# Patient Record
Sex: Female | Born: 1985 | Race: White | Hispanic: No | Marital: Single | Smoking: Current every day smoker
Health system: Southern US, Community
[De-identification: ages and names within clinical notes are randomized; demographics above are authoritative.]

## PROBLEM LIST (undated history)

## (undated) DIAGNOSIS — O1490 Unspecified pre-eclampsia, unspecified trimester: Secondary | ICD-10-CM

## (undated) DIAGNOSIS — O24419 Gestational diabetes mellitus in pregnancy, unspecified control: Secondary | ICD-10-CM

## (undated) DIAGNOSIS — J9383 Other pneumothorax: Secondary | ICD-10-CM

---

## 2015-02-23 ENCOUNTER — Emergency Department (HOSPITAL_COMMUNITY): Payer: Medicaid Other

## 2015-02-23 ENCOUNTER — Emergency Department (HOSPITAL_COMMUNITY)
Admission: EM | Admit: 2015-02-23 | Discharge: 2015-02-23 | Disposition: A | Discharge status: Home / Self Care | Payer: Medicaid Other | Attending: Emergency Medicine | Admitting: Emergency Medicine

## 2015-02-23 ENCOUNTER — Encounter (HOSPITAL_COMMUNITY): Payer: Self-pay | Admitting: Nurse Practitioner

## 2015-02-23 DIAGNOSIS — R200 Anesthesia of skin: Secondary | ICD-10-CM | POA: Insufficient documentation

## 2015-02-23 DIAGNOSIS — R1031 Right lower quadrant pain: Secondary | ICD-10-CM | POA: Insufficient documentation

## 2015-02-23 DIAGNOSIS — M545 Low back pain: Secondary | ICD-10-CM | POA: Insufficient documentation

## 2015-02-23 DIAGNOSIS — Z8632 Personal history of gestational diabetes: Secondary | ICD-10-CM | POA: Insufficient documentation

## 2015-02-23 DIAGNOSIS — R519 Headache, unspecified: Secondary | ICD-10-CM

## 2015-02-23 DIAGNOSIS — R61 Generalized hyperhidrosis: Secondary | ICD-10-CM | POA: Diagnosis not present

## 2015-02-23 DIAGNOSIS — R159 Full incontinence of feces: Secondary | ICD-10-CM | POA: Diagnosis not present

## 2015-02-23 DIAGNOSIS — R1032 Left lower quadrant pain: Secondary | ICD-10-CM | POA: Diagnosis not present

## 2015-02-23 DIAGNOSIS — R51 Headache: Secondary | ICD-10-CM | POA: Insufficient documentation

## 2015-02-23 DIAGNOSIS — R202 Paresthesia of skin: Secondary | ICD-10-CM | POA: Insufficient documentation

## 2015-02-23 DIAGNOSIS — Z720 Tobacco use: Secondary | ICD-10-CM | POA: Diagnosis not present

## 2015-02-23 HISTORY — DX: Other pneumothorax: J93.83

## 2015-02-23 HISTORY — DX: Gestational diabetes mellitus in pregnancy, unspecified control: O24.419

## 2015-02-23 HISTORY — DX: Unspecified pre-eclampsia, unspecified trimester: O14.90

## 2015-02-23 LAB — CBC
HCT: 43.4 % (ref 36.0–46.0)
Hemoglobin: 14.9 g/dL (ref 12.0–15.0)
MCH: 31.2 pg (ref 26.0–34.0)
MCHC: 34.3 g/dL (ref 30.0–36.0)
MCV: 90.8 fL (ref 78.0–100.0)
PLATELETS: 236 10*3/uL (ref 150–400)
RBC: 4.78 MIL/uL (ref 3.87–5.11)
RDW: 12.7 % (ref 11.5–15.5)
WBC: 7.6 10*3/uL (ref 4.0–10.5)

## 2015-02-23 LAB — COMPREHENSIVE METABOLIC PANEL
ALBUMIN: 4 g/dL (ref 3.5–5.0)
ALK PHOS: 75 U/L (ref 38–126)
ALT: 16 U/L (ref 14–54)
AST: 21 U/L (ref 15–41)
Anion gap: 8 (ref 5–15)
BUN: 7 mg/dL (ref 6–20)
CHLORIDE: 101 mmol/L (ref 101–111)
CO2: 27 mmol/L (ref 22–32)
CREATININE: 0.72 mg/dL (ref 0.44–1.00)
Calcium: 9.4 mg/dL (ref 8.9–10.3)
GFR calc non Af Amer: >60 mL/min (ref >60)
GLUCOSE: 82 mg/dL (ref 65–99)
Potassium: 3.8 mmol/L (ref 3.5–5.1)
SODIUM: 136 mmol/L (ref 135–145)
Total Bilirubin: 0.3 mg/dL (ref 0.3–1.2)
Total Protein: 6.7 g/dL (ref 6.5–8.1)

## 2015-02-23 LAB — I-STAT BETA HCG BLOOD, ED (MC, WL, AP ONLY): I-stat hCG, quantitative: <5 m[IU]/mL (ref <5)

## 2015-02-23 LAB — URINE MICROSCOPIC-ADD ON

## 2015-02-23 LAB — URINALYSIS, ROUTINE W REFLEX MICROSCOPIC
Bilirubin Urine: NEGATIVE
GLUCOSE, UA: NEGATIVE mg/dL
Ketones, ur: NEGATIVE mg/dL
LEUKOCYTES UA: NEGATIVE
Nitrite: NEGATIVE
PH: 6.5 (ref 5.0–8.0)
Protein, ur: NEGATIVE mg/dL
Specific Gravity, Urine: 1.007 (ref 1.005–1.030)
Urobilinogen, UA: 0.2 mg/dL (ref 0.0–1.0)

## 2015-02-23 MED ORDER — DIPHENHYDRAMINE HCL 50 MG/ML IJ SOLN
25.0000 mg | Freq: Once | INTRAMUSCULAR | Status: DC
Start: 2015-02-23 — End: 2015-02-23
  Filled 2015-02-23: qty 1

## 2015-02-23 MED ORDER — ACETAMINOPHEN 325 MG PO TABS
650.0000 mg | ORAL_TABLET | Freq: Once | ORAL | Status: AC
  Administered 2015-02-23: 650 mg via ORAL
  Filled 2015-02-23: qty 2

## 2015-02-23 MED ORDER — GADOBENATE DIMEGLUMINE 529 MG/ML IV SOLN
10.0000 mL | Freq: Once | INTRAVENOUS | Status: AC | PRN
  Administered 2015-02-23: 10 mL via INTRAVENOUS

## 2015-02-23 MED ORDER — PROMETHAZINE HCL 25 MG/ML IJ SOLN
25.0000 mg | Freq: Once | INTRAMUSCULAR | Status: DC
  Filled 2015-02-23: qty 1

## 2015-02-23 MED ORDER — MORPHINE SULFATE (PF) 2 MG/ML IV SOLN
2.0000 mg | Freq: Once | INTRAVENOUS | Status: DC
  Filled 2015-02-23: qty 1

## 2015-02-23 NOTE — ED Notes (Signed)
Pt refusing anything but tylenol.  Pt is driving.

## 2015-02-23 NOTE — ED Notes (Signed)
Patient transported to MRI 

## 2015-02-23 NOTE — ED Notes (Signed)
MRI to watch pts child until husband arrives in ED.

## 2015-02-23 NOTE — Discharge Instructions (Signed)
Please follow-up with Neurology for further evaluation of your symptoms. Your bloodwork, CT scan, and MRI did not show any cause for your symptoms today. Return to the ER if you have worsening or new symptoms such as high fever, seizures, fainting, chest pain, difficulty breathing, etc.    Please obtain all of your results from medical records or have your doctors office obtain the results - share them with your doctor - you should be seen at your doctors office in the next 2 days. Call today to arrange your follow up. Take the medications as prescribed. Please review all of the medicines and only take them if you do not have an allergy to them. Please be aware that if you are taking birth control pills, taking other prescriptions, ESPECIALLY ANTIBIOTICS may make the birth control ineffective - if this is the case, either do not engage in sexual activity or use alternative methods of birth control such as condoms until you have finished the medicine and your family doctor says it is OK to restart them. If you are on a blood thinner such as COUMADIN, be aware that any other medicine that you take may cause the coumadin to either work too much, or not enough - you should have your coumadin level rechecked in next 7 days if this is the case.  ?  It is also a possibility that you have an allergic reaction to any of the medicines that you have been prescribed - Everybody reacts differently to medications and while MOST people have no trouble with most medicines, you may have a reaction such as nausea, vomiting, rash, swelling, shortness of breath. If this is the case, please stop taking the medicine immediately and contact your physician.  ?  You should return to the ER if you develop severe or worsening symptoms.

## 2015-02-23 NOTE — ED Notes (Signed)
She c/o 3 day history headache when standing, numbness in BUE and BLE, losing control of her bowel and bladder, and lower back and flank pain when waking in the morning. She tried tylenol with no relief. She denies nausea, dizziness, vision chages, LOC. She is A&Ox4, resp e/u

## 2015-02-23 NOTE — ED Provider Notes (Signed)
CSN: [id number]     Arrival date & time 02/23/15  1218 History   First MD Initiated Contact with Patient 02/23/15 1430     Chief Complaint  Patient presents with  . Headache    Patient is a 29 y.o. female presenting with headaches.  Headache    Tracy Werner is an 29 y.o. female with h/o HTN who presents to the ED for evaluation of headache. She states the headache started 3 days ago. States onset was gradual and it has gotten worse and worse. She describes the pain as diffuse and constant. Denies prior history of headache. It is worse with postural change. She states that she has also had bowel incontinence, once every day for the past 3 days. States she will be sitting at home and with no warning will have a BM. Denies bladder incontinence. She does report low back and bilateral flank pain over the past 3 days. Denies dysuria or urinary frequency/urgency. States that she has noticed seeing spots with this new headache. Denies blurred vision or vision loss. She reports night sweats and hot flashes the past few nights as well. She does endorse numbness and tingling in her fingers and toes that has been going on over the past year. Denies saddle parasthesias. Denies syncope, fever, dizziness, N/V, chest pain, SOB. Denies IVDU. Denies unsteady gait or falls. Denies unintentional weight loss.   Past Medical History  Diagnosis Date  . Preeclampsia   . Gestational diabetes mellitus   . Spontaneous pneumothorax    Past Surgical History  Procedure Laterality Date  . Cesarean section  2014   History reviewed. No pertinent family history. Social History  Substance Use Topics  . Smoking status: Current Every Day Smoker    Types: Cigarettes  . Smokeless tobacco: None  . Alcohol Use: No   OB History    No data available     Review of Systems  Neurological: Positive for headaches.  All other systems reviewed and are negative.     Allergies  Augmentin  Home Medications   Prior to  Admission medications   Not on File   BP 139/96 mmHg  Pulse 102  Temp(Src) 98.5 F (36.9 C) (Oral)  Resp 16  SpO2 99%  LMP 02/18/2015 Physical Exam  Constitutional: She is oriented to person, place, and time. No distress.  HENT:  Right Ear: External ear normal.  Left Ear: External ear normal.  Nose: Nose normal.  Mouth/Throat: Oropharynx is clear and moist. No oropharyngeal exudate.  Eyes: Conjunctivae and EOM are normal. Pupils are equal, round, and reactive to light.  Neck: Normal range of motion. Neck supple.  Cardiovascular: Normal rate, regular rhythm, normal heart sounds and intact distal pulses.   No murmur heard. Pulmonary/Chest: Effort normal and breath sounds normal. No respiratory distress. She has no wheezes.  Abdominal: Soft. Bowel sounds are normal. She exhibits no distension. There is no tenderness. There is no CVA tenderness.  Genitourinary: Rectal exam shows anal tone normal.  Musculoskeletal: Normal range of motion. She exhibits no edema or tenderness.  Entire spine nontender.   Lymphadenopathy:    She has no cervical adenopathy.  Neurological: She is alert and oriented to person, place, and time.  A&O x3. Cranial nerves intact. UE and LE strength and sensation intact. Negative romberg. Negative pronator drift. Steady gait. Normal rectal tone.   Skin: Skin is warm and dry. She is not diaphoretic.  Psychiatric: She has a normal mood and affect.  Nursing note and  vitals reviewed.   ED Course  Procedures (including critical care time) Labs Review Labs Reviewed  URINALYSIS, ROUTINE W REFLEX MICROSCOPIC (NOT AT [facility]) - Abnormal; Notable for the following:    Hgb urine dipstick SMALL (*)    All other components within normal limits  URINE MICROSCOPIC-ADD ON - Abnormal; Notable for the following:    Squamous Epithelial / LPF FEW (*)    All other components within normal limits  COMPREHENSIVE METABOLIC PANEL  CBC  I-STAT BETA HCG BLOOD, ED ([facility], [facility], [facility] ONLY)     Imaging Review Ct Head Wo Contrast  02/23/2015  CLINICAL DATA:  Three-day history of headache.  Bowel incontinence. EXAM: CT HEAD WITHOUT CONTRAST TECHNIQUE: Contiguous axial images were obtained from the base of the skull through the vertex without intravenous contrast. COMPARISON:  None. FINDINGS: The ventricles are normal in size and configuration. There is no intracranial mass, hemorrhage, extra-axial fluid collection, or midline shift. Gray-white compartments are normal. No acute infarct evident. The bony calvarium appears intact. The mastoid air cells are clear. IMPRESSION: Study within normal limits. Electronically Signed   By: Bretta BangWilliam  Woodruff III M.D.   On: 02/23/2015 18:13   Mr Lumbar Spine W Wo Contrast  02/23/2015  CLINICAL DATA:  Low back and bilateral flank pain for 3 days with bowel incontinence. New headaches with visual changes. No acute injury or history of malignancy. Initial encounter. EXAM: MRI LUMBAR SPINE WITHOUT AND WITH CONTRAST TECHNIQUE: Multiplanar and multiecho pulse sequences of the lumbar spine were obtained without and with intravenous contrast. CONTRAST:  10mL MULTIHANCE GADOBENATE DIMEGLUMINE 529 MG/ML IV SOLN COMPARISON:  None. FINDINGS: Five lumbar type vertebral bodies are assumed. The alignment is normal aside from a possible mild scoliosis. There is no evidence of fracture or pars defect. The conus medullaris extends to the L1 level and appears normal. There is no abnormal intradural enhancement. There are no paraspinal abnormalities. The right kidney appears normal. The left kidney is not visualized in its normal anatomic location. All of the lumbar discs appear normal. There is no spinal stenosis or nerve root encroachment. There is no significant facet arthropathy. IMPRESSION: 1. Negative MRI of the lumbar spine. No explanation for the patient's symptoms. The distal thoracic cord and conus medullaris appear normal. 2. Nonvisualization of the left kidney in its  normal location. This may be secondary to an ectopic kidney, previous nephrectomy or congenital absence/hypoplasia. Electronically Signed   By: Carey BullocksWilliam  Veazey M.D.   On: 02/23/2015 16:30   I have personally reviewed and evaluated these images and lab results as part of my medical decision-making.   EKG Interpretation None      MDM   Final diagnoses:  Bowel incontinence  Headache, unspecified headache type    Given new headache with bowel incontinence, visual disturbances, night sweats, 1y history of extremity paresthesias, I am concerned about infectious vs neoplastic process. However, it is odd as she does not have fever, risk factors, or neuro exam as expected for cauda equina/epidural abscess, brain bleed/growth, etc. Given new headache and back pain with these concerning symptoms, will get CT head and MRI L-spine. Will check CMP, CBC, UA.    Labs unremarkable. 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 CT normal. Discussed results with pt. I cannot give explanation of her symptoms at this time. Will refer to neurology for outpatient f/u. Pt declines pain meds at this time. Return precautions given for new or worsening symptoms. Pt verbalized understanding.    Tracy GinsSerena Y  Yashvi Jasinski, PA-C 02/23/15 1846  Gilda Crease, MD 02/24/15 [phone]

## 2015-02-27 ENCOUNTER — Ambulatory Visit: Payer: Self-pay | Admitting: Neurology

## 2015-03-04 ENCOUNTER — Ambulatory Visit (INDEPENDENT_AMBULATORY_CARE_PROVIDER_SITE_OTHER): Payer: Medicaid Other | Admitting: Neurology

## 2015-03-04 ENCOUNTER — Encounter: Payer: Self-pay | Admitting: Neurology

## 2015-03-04 VITALS — BP 142/101 | HR 126 | Ht 66.0 in | Wt 122.5 lb

## 2015-03-04 DIAGNOSIS — G478 Other sleep disorders: Secondary | ICD-10-CM | POA: Diagnosis not present

## 2015-03-04 DIAGNOSIS — R202 Paresthesia of skin: Secondary | ICD-10-CM

## 2015-03-04 DIAGNOSIS — G4710 Hypersomnia, unspecified: Secondary | ICD-10-CM

## 2015-03-04 DIAGNOSIS — R159 Full incontinence of feces: Secondary | ICD-10-CM

## 2015-03-04 DIAGNOSIS — R400 Somnolence: Secondary | ICD-10-CM

## 2015-03-04 DIAGNOSIS — R200 Anesthesia of skin: Secondary | ICD-10-CM

## 2015-03-04 DIAGNOSIS — M542 Cervicalgia: Secondary | ICD-10-CM | POA: Diagnosis not present

## 2015-03-04 NOTE — Patient Instructions (Signed)
Overall you are doing fairly well but I do want to suggest a few things today:   Remember to drink plenty of fluid, eat healthy meals and do not skip any meals. Try to eat protein with a every meal and eat a healthy snack such as fruit or nuts in between meals. Try to keep a regular sleep-wake schedule and try to exercise daily, particularly in the form of walking, 20-30 minutes a day, if you can.   As far as diagnostic testing: sleep evaluation, MRi cervical spine  I would like to see you back as needed, sooner if we need to. Please call us with any interim questions, concerns, problems, updates or refill requests.   Our phone number is [phone]. We also have an after hours call service for urgent matters and there is a physician on-call for urgent questions. For any emergencies you know to call 911 or go to the nearest emergency room

## 2015-03-04 NOTE — Progress Notes (Signed)
[facility]    Provider:  Dr Lucia Gaskins Referring Provider: No ref. provider found Primary Care Physician:  No PCP Per Patient  CC:  Headaches  HPI:  Tracy Werner is a 29 y.o. female here as a referral from Dr. No ref. provider found for new onset headaches. Past medical history depression and anxiety. She had a headache for 3 days which resolved. She was seen in the ED due to the headache and bowel incontinence, she had no warning, no pain, it was loose soft stools, happened 3 days she would just find it in her pants. It happened several times. She was having night sweats and felt ill. No abdominal pains. She had a CT of the head and MRi of the lumbar spine. Bowel incontinence has resolved. No urinary incontinence.Denied any focal weakness, dysarthria, dysphagia. She wakes up in the middle of the night and her her hands are numb, she rolls over and changing positions helps. She gets some positional numbness in the legs when sitting for too long or in one position for too long but resolves with changes of position. No neck pain. She has raynaud's, her toes and fingers get cold and white and blue, has to keep them warm. No rashes, no joint pain, no lesions. She has aching muscles but otherwise negative. No family history of autoimmune or rheumatologic syndromes in the family. 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 Happens even taking a nap. Happens 1-2x a year. She can fall asleep very quickly when she is not on adderal. She takes adderral for excessive daytime fatigue because was tired all the time, she had an excessive need to go to sleep all the time. No cataplexy. Famil history of sleep paralysis and sister falls asleep at the wheel.   Reviewed notes, labs and imaging from outside physicians, which showed:  she presented to the ED for evaluation of headacche of 3 days.. Stated onset was gradual and it has gotten  worse and worse. She described the pain as diffuse and constant. Denied prior history of headache. It is worse with postural change. She stated that she has also had bowel incontinence, once every day for the past 3 days. Stated she will be sitting at home and with no warning will have a BM. Denied bladder incontinence. She did report low back and bilateral flank pain over the past 3 days. Denied dysuria or urinary frequency/urgency. Stated that she has noticed seeing spots with this new headache. Denies dlurred vision or vision loss. She reported night sweats and hot flashes the past few nights as well. She did endorse numbness and tingling in her fingers and toes going on over the past year. Denied saddle parasthesias. Denied syncope, fever, dizziness, N/V, chest pain, SOB. Denied IVDU. Denied unsteady gait or falls. Denied unintentional weight loss.     Ct showed No acute intracranial abnormalities including mass lesion or mass effect, hydrocephalus, extra-axial fluid collection, midline shift, hemorrhage, or acute infarction, large ischemic events (personally reviewed images)   MRI of the lumbar spine: 1. Negative MRI of the lumbar spine. No explanation for the patient's symptoms. The distal thoracic cord and conus medullaris appear normal. 2. 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 This may be secondary to an ectopic kidney, previous nephrectomy or congenital absence/hypoplasia.   Review of Systems: Patient complains of symptoms per HPI as well as the following symptoms: Feeling hot, snoring, constipation, aching  muscles, allergies, headache, numbness, weakness, depression, anxiety, too much sleep, decreased energy, sleepiness, snoring, restless legs. Pertinent negatives per HPI. All others negative.   Social History   Social History  . Marital Status: Single    Spouse Name: N/A  . Number of Children: 1  . Years of Education: N/A   Occupational History  . Unemployed     Social History Main Topics  . Smoking status: Current Every Day Smoker    Types: Cigarettes  . Smokeless tobacco: Not on file  . Alcohol Use: No  . Drug Use: No  . Sexual Activity: Yes    Birth Control/ Protection: None   Other Topics Concern  . Not on file   Social History Narrative   Lives at with sig. other and 2 yr old daughter.    Caffeine use: Drinks soda (3-4 liter in a week)       Family History  Problem Relation Age of Onset  . Migraines Neg Hx     Past Medical History  Diagnosis Date  . Preeclampsia   . Gestational diabetes mellitus   . Spontaneous pneumothorax     Past Surgical History  Procedure Laterality Date  . Cesarean section  2014    Current Outpatient Prescriptions  Medication Sig Dispense Refill  . amphetamine-dextroamphetamine (ADDERALL) 30 MG tablet Take 30 mg by mouth daily.  0  . citalopram (CELEXA) 40 MG tablet Take 60 mg by mouth at bedtime.  12  . acetaminophen (TYLENOL) 500 MG tablet Take 1,000 mg by mouth daily as needed for headache.     No current facility-administered medications for this visit.    Allergies as of 03/04/2015 - Review Complete 03/04/2015  Allergen Reaction Noted  . Augmentin [amoxicillin-pot clavulanate] Hives 02/23/2015    Vitals: BP 142/101 mmHg  Pulse 126  Ht  (1.676 m)  Wt 122 lb 8 oz (55.566 kg)  BMI 19.78 kg/m2  LMP 02/18/2015 Last Weight:  Wt Readings from Last 1 Encounters:  03/04/15 122 lb 8 oz (55.566 kg)   Last Height:   Ht Readings from Last 1 Encounters:  03/04/15  (1.676 m)    Physical exam: Exam: Gen: NAD, conversant, well nourised, well groomed                     CV: RRR, no MRG. No Carotid Bruits. No peripheral edema, warm, nontender Eyes: Conjunctivae clear without exudates or hemorrhage  Neuro: Detailed Neurologic Exam  Speech:    Speech is normal; fluent and spontaneous with normal comprehension.  Cognition:    The patient is oriented to person, place, and  time;     recent and remote memory intact;     language fluent;     normal attention, concentration,     fund of knowledge Cranial Nerves:    The pupils are equal, round, and reactive to light. The fundi are normal and spontaneous venous pulsations are present. Visual fields are full to finger confrontation. Extraocular movements are intact. Trigeminal sensation is intact and the muscles of mastication are normal. The face is symmetric. The palate elevates in the midline. Hearing intact. Voice is normal. Shoulder shrug is normal. The tongue has normal motion without fasciculations.   Coordination:    Normal finger to nose and heel to shin. Normal rapid alternating movements.   Gait:    Heel-toe and tandem gait are normal.   Motor Observation:    No asymmetry, no atrophy, and no involuntary movements  noted. Tone:    Normal muscle tone.    Posture:    Posture is normal. normal erect    Strength:    Strength is V/V in the upper and lower limbs.      Sensation: intact to LT     Reflex Exam:  DTR's:    Deep tendon reflexes in the upper and lower extremities are normal bilaterally.   Toes:    The toes are downgoing bilaterally.   Clonus:    Clonus is absent.      Assessment/Plan:  This is a new patient here for evaluation of headaches and bowel incontinence. The symptoms have resolved and her neurologic exam is normal. She does have numbness and tingling in the fingers which has been going on for a year.  SLEEP EVAL FOR NARCOLEPSY: She has sleep paralysis, She can fall asleep very quickly when she is not on adderal. She takes adderral for excessive daytime fatigue because was tired all the time, she had an excessive need to go to sleep all the time. No cataplexy. Family history of sleep paralysis and sister falls asleep at the wheel.  Neck pain, tingling in the fingers, bowel incontinence: Mri cervical spine  Naomie DeanAntonia Ahern, MD  [facility] [address] [place], [place] [id number]  Phone [phone] Fax [phone]

## 2015-03-05 ENCOUNTER — Encounter: Payer: Self-pay | Admitting: Neurology

## 2015-03-05 DIAGNOSIS — M542 Cervicalgia: Secondary | ICD-10-CM | POA: Insufficient documentation

## 2015-03-05 DIAGNOSIS — R202 Paresthesia of skin: Secondary | ICD-10-CM

## 2015-03-05 DIAGNOSIS — R200 Anesthesia of skin: Secondary | ICD-10-CM | POA: Insufficient documentation

## 2015-03-06 ENCOUNTER — Ambulatory Visit (INDEPENDENT_AMBULATORY_CARE_PROVIDER_SITE_OTHER): Payer: Medicaid Other | Admitting: Neurology

## 2015-03-06 ENCOUNTER — Encounter: Payer: Self-pay | Admitting: Neurology

## 2015-03-06 VITALS — BP 122/82 | HR 88 | Resp 20 | Ht 66.0 in | Wt 122.0 lb

## 2015-03-06 DIAGNOSIS — F321 Major depressive disorder, single episode, moderate: Secondary | ICD-10-CM

## 2015-03-06 DIAGNOSIS — G4753 Recurrent isolated sleep paralysis: Secondary | ICD-10-CM | POA: Diagnosis not present

## 2015-03-06 DIAGNOSIS — Z720 Tobacco use: Secondary | ICD-10-CM

## 2015-03-06 DIAGNOSIS — G4719 Other hypersomnia: Secondary | ICD-10-CM

## 2015-03-06 MED ORDER — MODAFINIL 100 MG PO TABS: 100.0000 mg | ORAL_TABLET | Freq: Every day | ORAL | Status: AC

## 2015-03-06 NOTE — Progress Notes (Signed)
[facility]   Provider:  Larey Seat, M D  Referring Provider: No ref. provider found Primary Care Physician:  No PCP Per Patient  Chief Complaint  Patient presents with  . New Patient (Initial Visit)    sleep paralysis, falls asleep easily when off adderall, vivid dreams, rm 11, with daughter    HPI:  Tracy Werner is a 29 y.o. female , seen here as an internal referral from Dr. Jaynee Eagles, her outside referral came through Dr. Layla Barter ,   Chief complaint according to patient : The patient reports onset of sleep paralysis at age 69.   The patient had seen Dr. Jaynee Eagles on 03-04-15 for headaches occurring without warning or aura. At the same time she reported bowel incontinence that happens several times. Which has meanwhile resolved. She reports Raynaud's syndrome and her toes and fingers get cold and bluish she has to keep them warm. She has aching muscles but otherwise her neurologic exam was negative. There is no family history of autoimmune or rheumatological syndromes in the family. She states that she will be asleep and dreaming and unable to move which is frightening to her. She will wake up not being able to move,for a couple of seconds,  but it happens even after taking just a nap.  These have have occurred since age 49 and happen about twice a year. She has been on Adderall for the treatment of ADHD but has helped with excessive daytime fatigue and sleepiness. She has stopped taking naps because of the unpleasantness of sleep paralysis. She dreams even during a short nap. Her dreams usually also pick up again where she left off ; for example if she has to go to the bathroom and falls back asleep, the dream continues.   Sleep habits are as follows: She usually goes to the bedroom between 10 and 11 PM, she usually reads in bed until she falls asleep before she took Adderall she could fall asleep immediately. Once asleep she will not stay asleep but is tossing and turning.  She feels that she is a very light sleeper somewhere between sleep and wakefulness. She feels numb with tingling and numbness in her hand and sometimes in her legs when she remains in the same position. Therefore she moves at night a lot. She reports every night to dream and very visit dreams. Sometimes the dreams wake her up. She reports that she is often in a tri-lead zone between reality and dream and until she realizes that she is dreaming she cannot escape the dream actively. The dreams threatening the dreams always scary, she usually feels trapped or threatened. The dreams are very life like.  Sometimes she dreams that she has to escape a burning house or escape from a car accident. She usually rides around 8 AM her infant daughter will wake her. She could usually sleep much into the morning hours. She's not sure how many hours of nocturnal sleep she actually gets but probably around 7. She would struggle all day she reports fighting the urge to go to sleep if she wouldn't be on her current medication, Adderall.   She has not experienced muscle tone loss in response to enjoy or anger or being startled.   She is currently at every day smoker on Tylenol, Celexa, Adderall and Dr. Jaynee Eagles had just ordered an MRI of the cervical spine  Sleep medical history and family sleep history: She has a family history of sleep paralysis and a sister  has fallen asleep at the wheel.   Social history: mother of an infant daughter, married.   Review of Systems:  Fatigue severity score is endorsed at 57 points Epworth sleepiness score at 14 points . Out of a complete 14 system review, the patient complains of only the following symptoms, and all other reviewed systems are negative.      Social History   Social History  . Marital Status: Single    Spouse Name: N/A  . Number of Children: 1  . Years of Education: N/A   Occupational History  . Unemployed    Social History Main Topics  . Smoking status:  Current Every Day Smoker    Types: Cigarettes  . Smokeless tobacco: Not on file  . Alcohol Use: No  . Drug Use: No  . Sexual Activity: Yes    Birth Control/ Protection: None   Other Topics Concern  . Not on file   Social History Narrative   Lives at with sig. other and 2 yr old daughter.    Caffeine use: Drinks soda (3-4 liter in a week)       Family History  Problem Relation Age of Onset  . Migraines Neg Hx   . Hypertension Mother   . Diabetes Father   . Hypertension Father     Past Medical History  Diagnosis Date  . Preeclampsia   . Gestational diabetes mellitus   . Spontaneous pneumothorax     Past Surgical History  Procedure Laterality Date  . Cesarean section  2014    Current Outpatient Prescriptions  Medication Sig Dispense Refill  . acetaminophen (TYLENOL) 500 MG tablet Take 1,000 mg by mouth daily as needed for headache.    . amphetamine-dextroamphetamine (ADDERALL) 30 MG tablet Take 30 mg by mouth daily.  0  . citalopram (CELEXA) 40 MG tablet Take 60 mg by mouth at bedtime.  12   No current facility-administered medications for this visit.    Allergies as of 03/06/2015 - Review Complete 03/06/2015  Allergen Reaction Noted  . Augmentin [amoxicillin-pot clavulanate] Hives 02/23/2015    Vitals: BP 122/82 mmHg  Pulse 88  Resp 20  Ht 5' 6"  (1.676 m)  Wt 122 lb (55.339 kg)  BMI 19.70 kg/m2  LMP 02/18/2015 Last Weight:  Wt Readings from Last 1 Encounters:  03/06/15 122 lb (55.339 kg)   WNU:UVOZ mass index is 19.7 kg/(m^2).     Last Height:   Ht Readings from Last 1 Encounters:  03/06/15 5' 6"  (1.676 m)    Physical exam:  General: The patient is awake, alert and appears not in acute distress. The patient is well groomed. Head: Normocephalic, atraumatic. Neck is supple. Mallampati 2 ,  neck circumference: 13.5 . Nasal airflow urestcricted , TMJ click  evident . Retrognathia is not seen. No bruxism.  Cardiovascular:  Regular rate and rhythm   without  murmurs or carotid bruit, and without distended neck veins. Respiratory: Lungs are clear to auscultation. Skin:  Without evidence of edema, or rash Trunk: BMI is normal . The patient's posture is erect   Neurologic exam : The patient is awake and alert, oriented to place and time.   Memory subjective described as intact.     Attention span & concentration ability appears normal.  Speech is fluent, without dysarthria, dysphonia or aphasia.  Mood and affect are appropriate.  Cranial nerves: Pupils are equal and briskly reactive to light. Funduscopic exam without  evidence of pallor or edema.  Extraocular movements  in vertical and horizontal planes intact and without nystagmus. Visual fields by finger perimetry are intact. Hearing to finger rub intact.   Facial sensation intact to fine touch.  Facial motor strength is symmetric and tongue and uvula move midline. Shoulder shrug was symmetrical.   Motor exam:  Normal tone, muscle bulk and symmetric strength in all extremities.  Sensory:  Fine touch, pinprick and vibration were tested in all extremities.  Proprioception tested in the upper extremities was normal.  Coordination: Rapid alternating movements in the fingers/hands was normal. Finger-to-nose maneuver  normal without evidence of ataxia, dysmetria or tremor.  Gait and station: Patient walks without assistive device and is able unassisted to climb up to the exam table. Strength within normal limits.  Stance is stable and normal.  Toe and hell stand were tested .Tandem gait is unfragmented. Turns with  3  Steps. Romberg testing is  negative.  Deep tendon reflexes: in the  upper and lower extremities are symmetric and intact. Babinski maneuver response is  downgoing.  The patient was advised of the nature of the diagnosed sleep disorder , the treatment options and risks for general a health and wellness arising from not treating the condition.  I spent more than 35 minutes  of face to face time with the patient. Greater than 50% of time was spent in counseling and coordination of care. We have discussed the diagnosis and differential and I answered the patient's questions.     Assessment:  After physical and neurologic examination, review of laboratory studies,  Personal review of imaging studies, reports of other /same  Imaging studies ,  Results of  Previous polysomnography/ neurophysiology testing and pre-existing records as far as provided in visit., my assessment is   1) Mrs. Nuon is a young Caucasian right-handed female and a new mother. She reports having sleep paralysis in isolated periods once or twice a year since age 43. She reports dream intrusion hypnagogic and hypnopompic hallucinations but not cataplectic attacks. She has been partially treated as if having narcolepsy by taking Adderall. The purpose of Adderall was 2 treat ADHD but it helps her daytime sleepiness. She has also been placed on an SSRI which suppresses some of her REM sleep.   2) the patient discussed that she has some of the ADD-ADHD criteria but not all of them. 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 She does not consider his of impulsive or impatient. She is usually not always in motion.   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 An MS LT in a sleep study would not likely be valid given that she is on medications that would cover the diagnostic evidence. I would order an HLA test for narcolepsy which is a blood test and does not require any change in medication.     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 Adderall per Dr Toy Care.  Celexa on 40 mg , may need Dr. Toy Care 's input to wean. She took this medication throughout pregnancy 2014.Asencion Partridge Tyrese Ficek MD  03/06/2015   CC: Dr   Marquis Lunch, MD  Sarina Ill, MD

## 2015-03-06 NOTE — Patient Instructions (Addendum)
Narcolepsy Narcolepsy is a nervous system disorder that causes daytime sleepiness and sudden bouts of irresistible sleep during the day (sleep attacks). Many people with narcolepsy live with extreme daytime sleepiness for many years before being diagnosed and treated. Narcolepsy is a lifelong (chronic) disorder.  You normally go through cycles when you sleep. When your sleep becomes deeper, you have less body movement, and you start dreaming. This type of deep sleep should happen after about 90 minutes of lighter sleep. Deep sleep is called rapid eye movement (REM) sleep. When you have narcolepsy, REM is not well regulated. It can happen as soon as you fall asleep, and components of REM sleep can occur during the day when you are awake. Uncontrolled REM sleep causes symptoms of narcolepsy. CAUSES Narcolepsy may be caused by an abnormality with a chemical messenger (neurotransmitter) in your brain that controls your sleep and wake cycles. Most people with narcolepsy have low levels of the neurotransmitter hypocretin. Hypocretin is important for controlling wakefulness.  A hypocretin imbalance may be caused by abnormal genes that are passed down through families. It may also develop if the body's defense system (immune system) mistakenly attacks the brain cells that produce hypocretin (autoimmune disease). RISK FACTORS You may be at higher risk for narcolepsy if you have a family history of the disease. Other risk factors that may contribute include:  Injuries, tumors, or infections in the areas of the brain that control sleep.  Exposure to toxins.  Stress.  Hormones produced during puberty and menopause.  Poor sleep habits. SIGNS AND SYMPTOMS  Symptoms of narcolepsy can start at any age but usually begin when people are between the ages of 7 and 25 years. There are four major symptoms. Not everyone with narcolepsy will have all four.   Excessive daytime sleepiness. This is the most common symptom  and is usually the first symptom you will notice.  You may feel as if you are in a mental fog.  Daytime sleepiness may severely affect your performance at work or school.  You may fall asleep during a conversation or while eating dinner.  Sudden loss of muscle tone (cataplexy). You do not lose consciousness, but you may suddenly lose muscle control. When this occurs, your speech may become slurred, or your knees may buckle. This symptom is usually triggered by surprise, anger, or laughter.  Sleep paralysis. You may lose the ability to speak or move just as you start to fall asleep or wake up. You will be aware of the paralysis. It usually lasts for just a few seconds or minutes.  Vivid hallucinations. These may occur with sleep paralysis. The hallucinations are like having bizarre or frightening dreams while you are still awake. Other symptoms may include:   Trouble staying asleep at night (insomnia).  Restless sleep.  Feeling a strong urge to get up at night to smoke or eat. DIAGNOSIS  Your health care provider can diagnose narcolepsy based on your symptoms and the results of two diagnostic tests. You may also be asked to keep a sleep diary for several weeks. You may need the tests if you have had daytime sleepiness for at least 3 months. The two tests are:   A polysomnogram to find out how well your REM sleep is regulated at night. This test is an overnight sleep study. It measures your heart rate, breathing, movement, and brain waves.  A multiple sleep latency test (MSLT) to find out how well your REM sleep is regulated during the day. This   is a daytime sleep study. You may need to take several naps during the day. This test also measures your heart rate, breathing, movement, and brain waves. TREATMENT  There is no cure for narcolepsy, but treatment can be very effective in helping manage the condition. Treatment may include:  Lifestyle and sleeping strategies to help cope with the  condition.  Medicines. These may include:  Stimulant medicines to fight daytime sleepiness.  Antidepressant medicines to treat cataplexy.  Sodium oxybate. This is a strong sedative that you take at night. It can help daytime sleepiness and cataplexy. HOME CARE INSTRUCTIONS  Take all medicines as directed by your health care provider.  Follow these sleep practices:  Try to get about 8 hours of sleep every night.  Go to sleep and get up close to the same time every day.  Keep your bedroom dark, quiet, and comfortable.  Schedule short naps for when you feel sleepiest during the day. Tell your employer or teachers that you have narcolepsy. You may be able to adjust your schedule to include time for naps.  Try to get at least 20 minutes of exercise every day. This will help you sleep better at night and reduce daytime sleepiness.  Do not drink alcohol or caffeinated beverages within 4-5 hours of bedtime.  Do not eat a heavy meal before bedtime. Eat at about the same times every day.  Do not smoke.  Do not drive if you are sleepy or have untreated narcolepsy.  Do not swim or go out on the water without a life jacket. SEEK MEDICAL CARE IF: Your medicines are not controlling your narcolepsy symptoms. SEEK IMMEDIATE MEDICAL CARE IF:  You hurt yourself during a sleep attack or an attack of cataplexy.  You have chest pain or trouble breathing.   This information is not intended to replace advice given to you by your health care provider. Make sure you discuss any questions you have with your health care provider.   Document Released: 04/03/2002 Document Revised: 05/04/2014 Document Reviewed: 04/05/2013 Elsevier Interactive Patient Education 2016 [employer]   Modafinil can interfere with your birth-control pill so I strongly recommend a barrier contraception such as condylomas or an intrauterine ring .

## 2015-03-14 ENCOUNTER — Other Ambulatory Visit: Payer: Self-pay | Admitting: Neurology

## 2015-03-14 ENCOUNTER — Telehealth: Payer: Self-pay

## 2015-03-14 NOTE — Telephone Encounter (Signed)
Spoke to pt and advised her that her HLA test was negative. Pt denied questions at this time. Pt verbalized understanding. Pt knows to call us with questions.

## 2015-03-14 NOTE — Telephone Encounter (Signed)
Called to advise pt that her HLA test was negative. Her number listed does not have a VM set up, so unable to leave a message. Will try again later.

## 2015-03-16 ENCOUNTER — Inpatient Hospital Stay: Admission: RE | Admit: 2015-03-16 | Payer: Medicaid Other | Source: Ambulatory Visit

## 2016-06-05 IMAGING — MR MR LUMBAR SPINE WO/W CM
4 of 10 series · 19 of 48 positions shown · IV contrast (multihance)
Comparison: None.

CLINICAL DATA: Low back and bilateral flank pain for 3 days with
bowel incontinence. New headaches with visual changes. No acute
injury or history of malignancy. Initial encounter.

EXAM:
MRI LUMBAR SPINE WITHOUT AND WITH CONTRAST
TECHNIQUE: Multiplanar and multiecho pulse sequences of the lumbar spine were
obtained without and with intravenous contrast.
CONTRAST:  10mL MULTIHANCE GADOBENATE DIMEGLUMINE 529 MG/ML IV SOLN

[Series 7: T2 · axial · 4.0mm · 0.39mm/px · z∈[+105,+353]mm · 9 of 46 slices shown (1 of 2)]
[im 1/46]
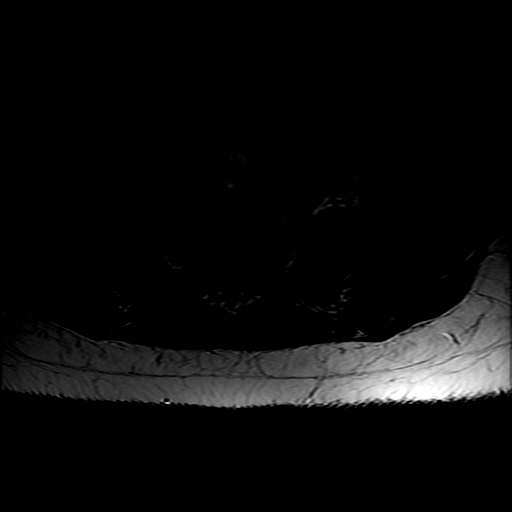
[im 6/46]
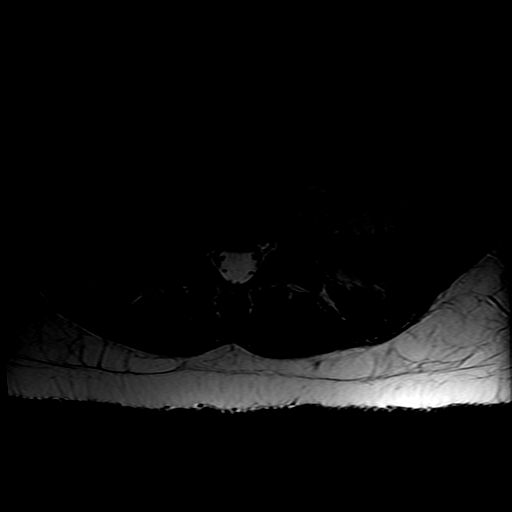
[im 12/46]
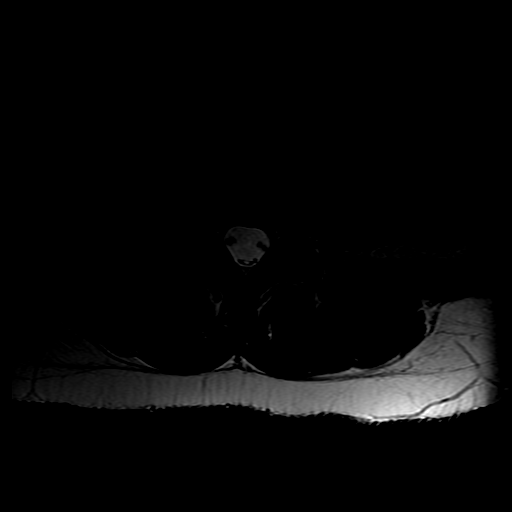
[im 17/46]
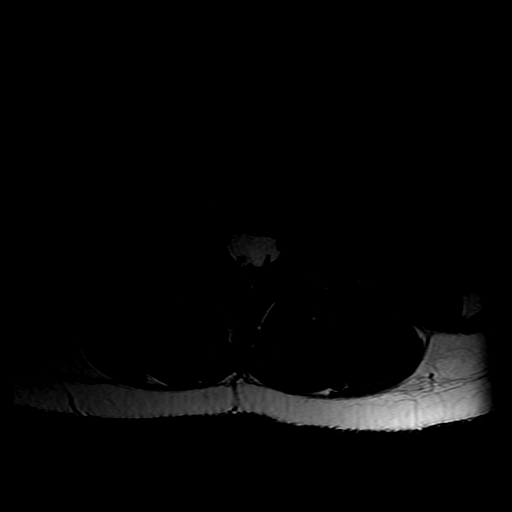
[im 23/46]
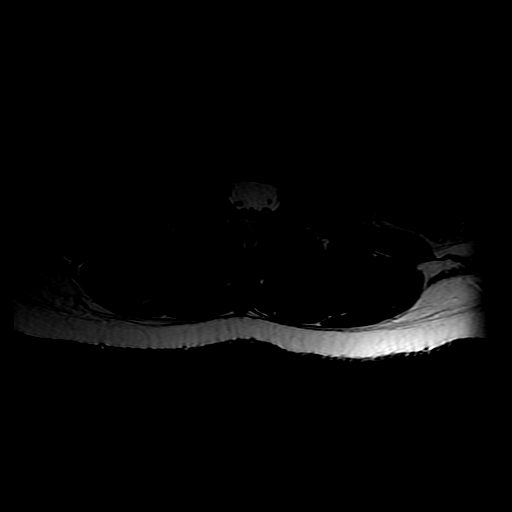
[im 29/46]
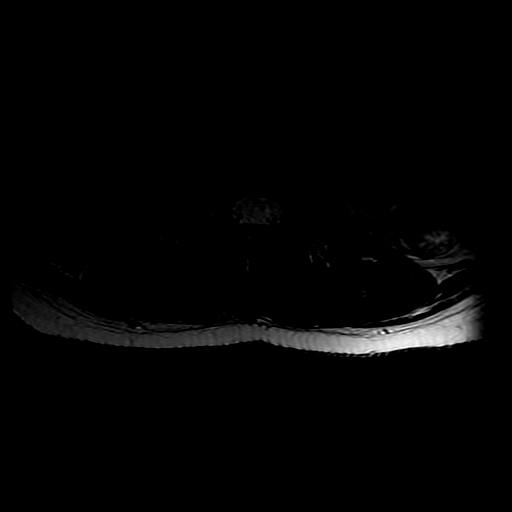
[im 34/46]
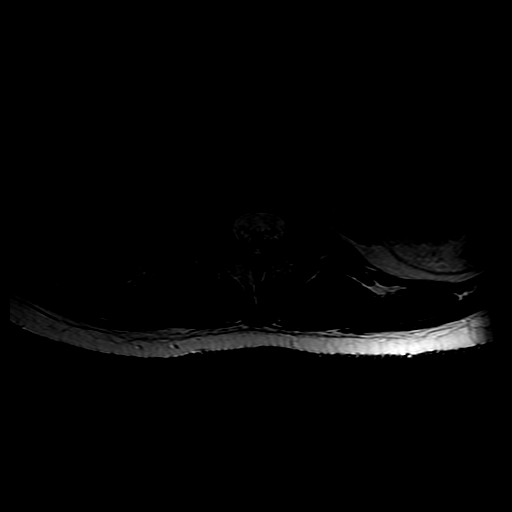
[im 40/46]
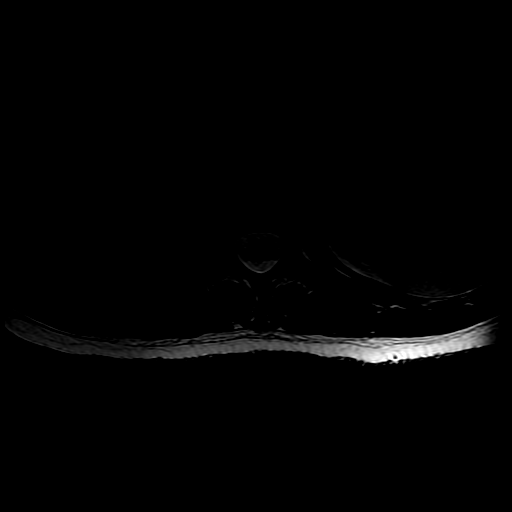
[im 46/46]
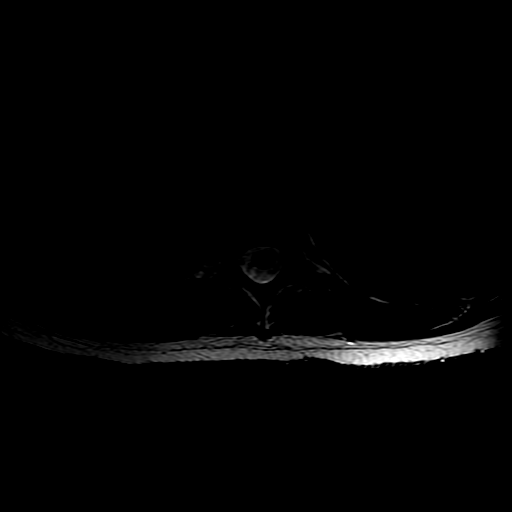

[Series 8: T1 · axial · 4.0mm · 0.39mm/px · z∈[+105,+323]mm · 6 of 46 slices shown]
[im 1/46]
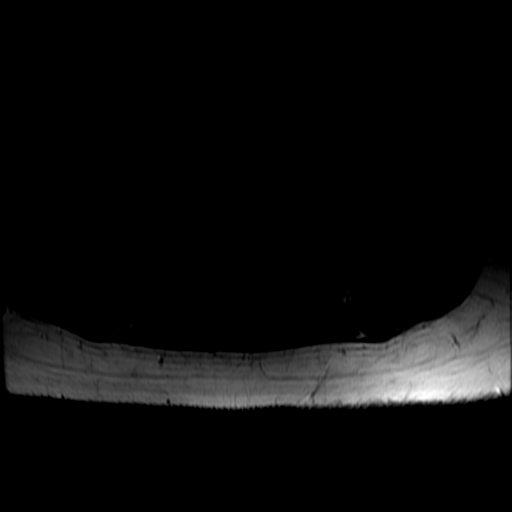
[im 6/46]
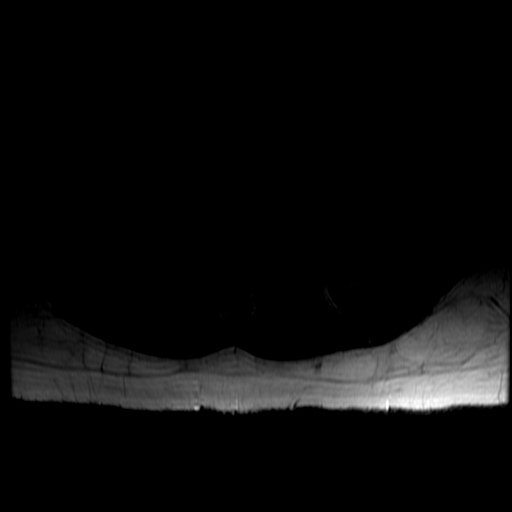
[im 12/46]
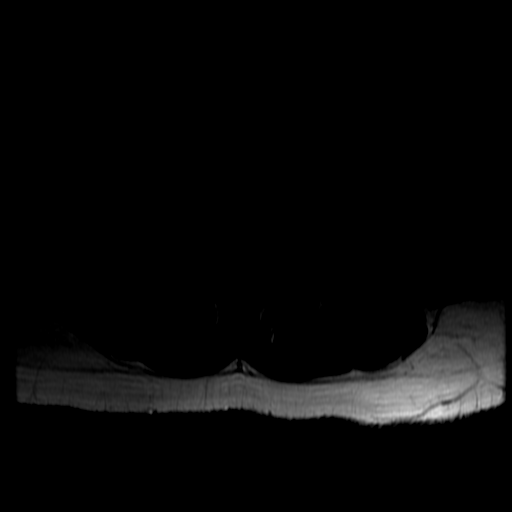
[im 17/46]
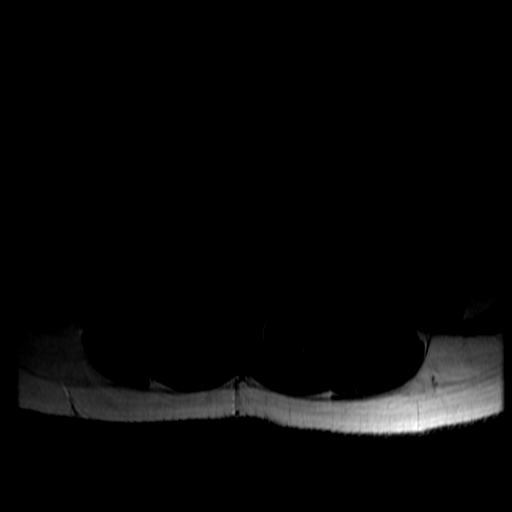
[im 23/46]
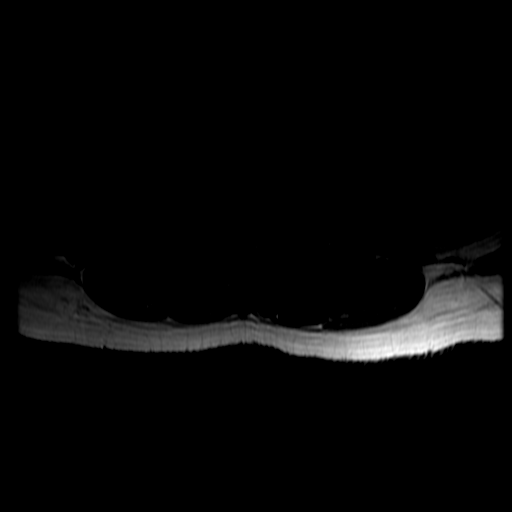
[im 40/46]
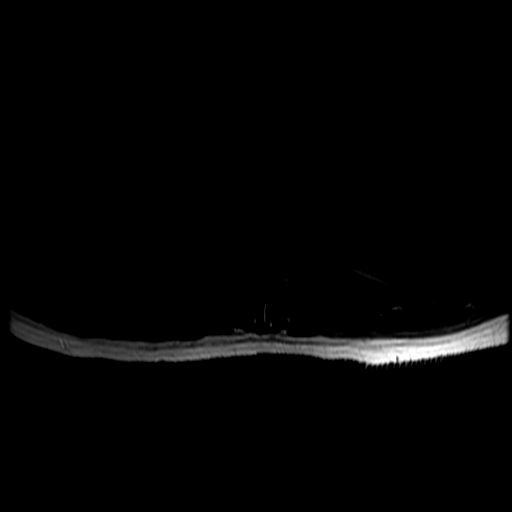

[Series 300: T2 · sagittal · 4.0mm · 0.55mm/px · 2 of 13 slices shown (2 of 2)]
[im 1/13]
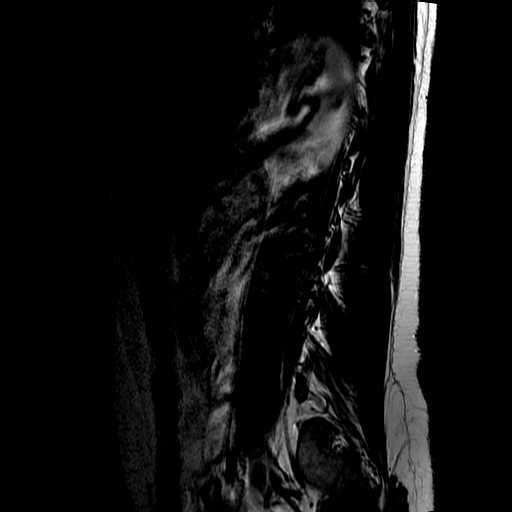
[im 13/13]
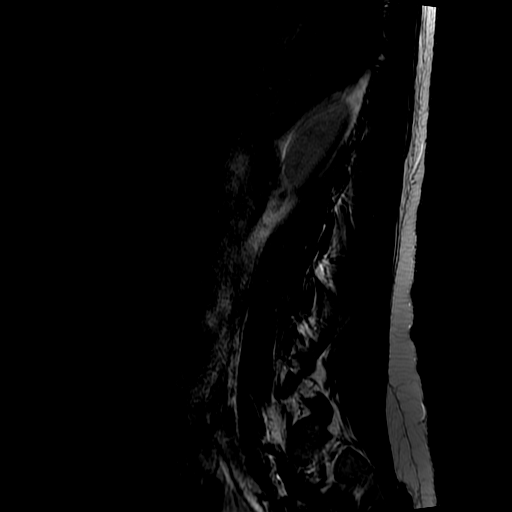

[Series 1200: T2 post-contrast · sagittal · 4.0mm · 0.55mm/px · 2 of 13 slices shown]
[im 1/13]
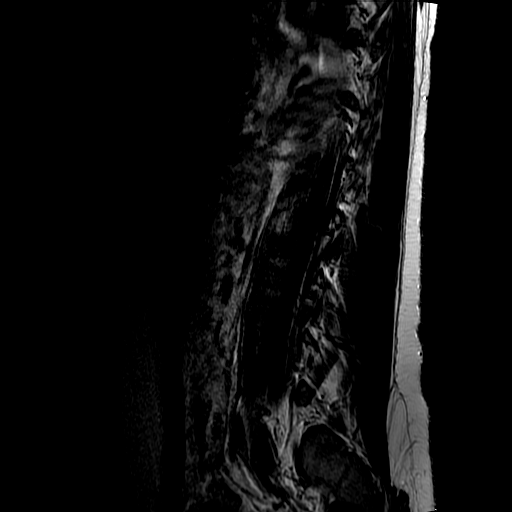
[im 13/13]
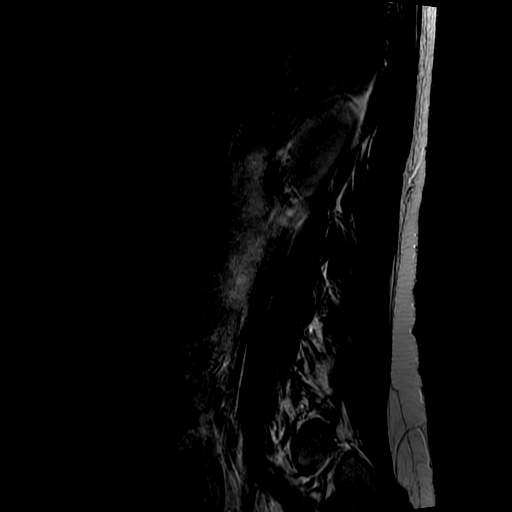

[19 of 48 positions shown; findings below may reference images not displayed]

FINDINGS: Five lumbar type vertebral bodies are assumed. The alignment is
normal aside from a possible mild scoliosis. There is no evidence of
fracture or pars defect.

The conus medullaris extends to the L1 level and appears normal.
There is no abnormal intradural enhancement.

There are no paraspinal abnormalities. The right kidney appears
normal. The left kidney is not visualized in its normal anatomic
location.

All of the lumbar discs appear normal. There is no spinal stenosis
or nerve root encroachment. There is no significant facet
arthropathy.
IMPRESSION: 1. Negative MRI of the lumbar spine. No explanation for the
patient's symptoms. The distal thoracic cord and conus medullaris
appear normal.
2. Nonvisualization of the left kidney in its normal location. This
may be secondary to an ectopic kidney, previous nephrectomy or
congenital absence/hypoplasia.
